# Patient Record
Sex: Male | Born: 1975 | Race: Black or African American | Hispanic: No | Marital: Single | Smoking: Current every day smoker
Health system: Southern US, Community
[De-identification: ages and names within clinical notes are randomized; demographics above are authoritative.]

## PROBLEM LIST (undated history)

## (undated) HISTORY — PX: HAND TENDON SURGERY: SHX663

---

## 2001-03-17 ENCOUNTER — Ambulatory Visit (HOSPITAL_BASED_OUTPATIENT_CLINIC_OR_DEPARTMENT_OTHER): Admission: RE | Admit: 2001-03-17 | Discharge: 2001-03-17 | Payer: Self-pay | Admitting: Orthopedic Surgery

## 2009-04-13 ENCOUNTER — Emergency Department (HOSPITAL_COMMUNITY): Admission: EM | Admit: 2009-04-13 | Discharge: 2009-04-13 | Payer: Self-pay | Admitting: Emergency Medicine

## 2009-04-15 ENCOUNTER — Emergency Department (HOSPITAL_COMMUNITY): Admission: EM | Admit: 2009-04-15 | Discharge: 2009-04-15 | Payer: Self-pay | Admitting: Emergency Medicine

## 2010-04-11 ENCOUNTER — Emergency Department (HOSPITAL_COMMUNITY): Admission: EM | Admit: 2010-04-11 | Discharge: 2010-04-11 | Payer: Self-pay | Admitting: Emergency Medicine

## 2010-12-07 NOTE — Op Note (Signed)
[place]  Patient:    Blake Meyers, Blake Meyers Visit Number: [id number] MRN: [id number]          Service Type: DSU Location: [facility] Attending Physician:  Susa Day Proc. Date: 03/17/01 Adm. Date:  03/17/2001   CC:         Eula Listen, M.D.   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  OPERATION PERFORMED:  Reconstruction of right thumb radial collateral ligament and dorsal radial capsulorrhaphy.  SURGEON:  Katy Fitch. Sypher, Montez Hageman., M.D.  ASSISTANT:  Jonni Sanger, P.A.  ANESTHESIA:  General by LMA.  SUPERVISING ANESTHESIOLOGIST:  Dr. Gelene Mink.  INDICATIONS FOR PROCEDURE:  The patient is a 35 year old male who was referred by Eula Listen, M.D. for evaluation and management of a complex right thumb injury.  He had been previously treated in [facility] by Dr. Arletha Grippe with an attempted reconstruction of his right thumb radial collateral ligament following injury with development of chronic radial collateral ligament and dorsal capsular instability.  He was noted to have both a pronation and palmar subluxation of his proximal phalanx off the metacarpal head.  He had a chronic pain syndrome and pinch impairment.  After informed consent he is brought to the operating room at this time anticipating reconstruction of his right thumb radial collateral ligament with an abductor pollicis longus proximally based tendon graft.  DESCRIPTION OF PROCEDURE:  Blake Meyers was brought to the operating room and placed in supine position on the operating table.  Following induction of general anesthesia by LMA, the right arm  was prepped with Betadine soap and solution and sterilely draped.  1 gm of Ancef was administered as an IV prophylactic antibiotic.  Following exsanguination of the limb with an Esmarch bandage, the arterial tourniquet was inflated to 220 mmHg.  The procedure commenced with excision of his previous dorsal radial surgical scar.  This was extended to a midlateral incision along the radial aspect of the proximal phalanx.  Great care was taken to identify the dorsal radial sensory branch on the dorsal radial aspect of the thumb and protect it with a vessel loop throughout dissection.  There was extensive scarring at the dorsal aspect of the metacarpal head and great care was taken to identify the remnants of the radial collateral ligament origin of the metacarpal head.  The Beaver blade was used to elevate the entire origin of the radial collateral ligament and essentially create a flap of the radial collateral ligament based distally at the critical corner of the proximal phalanx.  Care was taken to preserve the volar plate throughout dissection.  The radial critical corner of the proximal phalanx was exposed and a small cavity created with a microcuret measuring approximately 3 mm in height, 2 mm in width and 2 mm in depth.  The joint was reduced and with great care, two Mellody Dance needles were placed across the base of the proximal phalanx placing a pair of 3-0 Ethibond sutures through the most palmar drill tract initially and preserving the second Keith needle for passage of a more dorsal slip once the abductor pollicis longus tendon graft was placed.  The dorsal slip of the abductor pollicis longus tendons was released at the musculotendinous junction through a short transverse incision, brought through  the first dorsal compartment through a second incision and carefully passed deep to the thenar muscles and deep to the radial sensory branches on the dorsal aspect of the thumb  metacarpal to the wound created at the MP joint.  The bony trough was created on an oblique angle at the neck of the metacarpal to anchor the abductor pollicis longus tendon at a proper angle of approach to recreate the radial collateral ligament.  The abductor pollicis longus tendon graft was then sutured into the drill hole at the base of the proximal phalanx and tensioned appropriately using a polypropylene button on the ulnar aspect of the thumb proximal phalanx tensioning this repair so that it had good inset into the bony trough.  Once the polypropylene button was secured with knotting of the suture slips over the button, the abductor pollicis longus graft was tensioned by lifting it into the bony trough recreating the proper angled pole for radial collateral ligament.  A very satisfactory ligament reconstruction was achieved.  This was then sutured to bone with through bone sutures through the metacarpal head securing the collateral ligament reconstruction proximal to the trough.  The free slip of the abductor pollicis longus was then tacked over the top of this for further reinforcement of this collateral ligament reconstruction.  The entire distally based tongue of capsule and radial collateral ligament was then sewn over the radial collateral ligament tendon transfer repair and appropriately tensioned to create an imbrication of the dorsal capsule. Thereafter, the ligament stability of the radial collateral ligament was checked.  At 0 degrees prior to surgery there was more than 35 degrees of ulnar deviation.  Following reconstruction, there was less than 5 degrees at 0 degrees flexion.  At 30 degrees flexion there was no ulnar deviation versus approximately 30 degrees prior to surgery.  The volar plate was performing normally.  The wound was then irrigated thoroughly and repaired with intradermal 3-0 Prolene as were the two transverse wounds for harvest of the abductor  pollicis longus.  There were no apparent complications.   The tourniquet was released and bleeding controlled by direct pressure. Blake Meyers was placed in a compressive dressing with a thumb spica splint protecting the thumb in a palmar abductor position.  There were no apparent complications.  For aftercare he was given prescriptions for Percocet 5 mg one or two tablets p.o. q.4-6h. p.r.n. pain, Keflex 500 mg one p.o. q.8h. x 4 days as a prophylactic antibiotic and Motrin 600 mg one p.o. q.6h. p.r.n. pain, 30 tablets without refill.Attending Physician:  Susa Day DD:  03/17/01 TD:  03/17/01 Job: [phone]

## 2016-05-28 ENCOUNTER — Encounter (HOSPITAL_COMMUNITY): Payer: Self-pay | Admitting: Emergency Medicine

## 2016-05-28 ENCOUNTER — Emergency Department (HOSPITAL_COMMUNITY)
Admission: EM | Admit: 2016-05-28 | Discharge: 2016-05-28 | Disposition: A | Discharge status: Home / Self Care | Payer: BLUE CROSS/BLUE SHIELD | Attending: Emergency Medicine | Admitting: Emergency Medicine

## 2016-05-28 DIAGNOSIS — T310 Burns involving less than 10% of body surface: Secondary | ICD-10-CM | POA: Diagnosis not present

## 2016-05-28 DIAGNOSIS — T542X1A Toxic effect of corrosive acids and acid-like substances, accidental (unintentional), initial encounter: Secondary | ICD-10-CM | POA: Insufficient documentation

## 2016-05-28 DIAGNOSIS — Y929 Unspecified place or not applicable: Secondary | ICD-10-CM | POA: Insufficient documentation

## 2016-05-28 DIAGNOSIS — F17210 Nicotine dependence, cigarettes, uncomplicated: Secondary | ICD-10-CM | POA: Insufficient documentation

## 2016-05-28 DIAGNOSIS — Y999 Unspecified external cause status: Secondary | ICD-10-CM | POA: Insufficient documentation

## 2016-05-28 DIAGNOSIS — Y939 Activity, unspecified: Secondary | ICD-10-CM | POA: Insufficient documentation

## 2016-05-28 DIAGNOSIS — M79672 Pain in left foot: Secondary | ICD-10-CM | POA: Insufficient documentation

## 2016-05-28 DIAGNOSIS — T25422A Corrosion of unspecified degree of left foot, initial encounter: Secondary | ICD-10-CM

## 2016-05-28 DIAGNOSIS — X12XXXA Contact with other hot fluids, initial encounter: Secondary | ICD-10-CM | POA: Insufficient documentation

## 2016-05-28 MED ORDER — CEPHALEXIN 500 MG PO CAPS
500.0000 mg | ORAL_CAPSULE | Freq: Once | ORAL | Status: AC
  Administered 2016-05-28: 500 mg via ORAL
  Filled 2016-05-28: qty 1

## 2016-05-28 MED ORDER — IBUPROFEN 800 MG PO TABS: 800.0000 mg | ORAL_TABLET | Freq: Three times a day (TID) | ORAL | 0 refills | Status: DC

## 2016-05-28 MED ORDER — CEPHALEXIN 500 MG PO CAPS: 500.0000 mg | ORAL_CAPSULE | Freq: Four times a day (QID) | ORAL | 0 refills | Status: DC

## 2016-05-28 MED ORDER — IBUPROFEN 800 MG PO TABS
800.0000 mg | ORAL_TABLET | Freq: Once | ORAL | Status: AC
  Administered 2016-05-28: 800 mg via ORAL
  Filled 2016-05-28: qty 1
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
## 2016-05-28 NOTE — Discharge Instructions (Signed)
Please cleanse the wound to your foot with soap and water and apply dressing daily until healed. Please elevate your foot is much as possible. Use Keflex with breakfast, lunch, dinner, and at bedtime.

## 2016-12-02 ENCOUNTER — Emergency Department (HOSPITAL_COMMUNITY)
Admission: EM | Admit: 2016-12-02 | Discharge: 2016-12-02 | Disposition: A | Discharge status: Home / Self Care | Payer: BLUE CROSS/BLUE SHIELD | Attending: Emergency Medicine | Admitting: Emergency Medicine

## 2016-12-02 ENCOUNTER — Encounter (HOSPITAL_COMMUNITY): Payer: Self-pay | Admitting: Emergency Medicine

## 2016-12-02 DIAGNOSIS — H1032 Unspecified acute conjunctivitis, left eye: Secondary | ICD-10-CM | POA: Diagnosis not present

## 2016-12-02 DIAGNOSIS — R05 Cough: Secondary | ICD-10-CM | POA: Insufficient documentation

## 2016-12-02 DIAGNOSIS — B309 Viral conjunctivitis, unspecified: Secondary | ICD-10-CM

## 2016-12-02 DIAGNOSIS — R059 Cough, unspecified: Secondary | ICD-10-CM

## 2016-12-02 DIAGNOSIS — F17210 Nicotine dependence, cigarettes, uncomplicated: Secondary | ICD-10-CM | POA: Diagnosis not present

## 2016-12-02 DIAGNOSIS — H578 Other specified disorders of eye and adnexa: Secondary | ICD-10-CM | POA: Diagnosis present

## 2016-12-02 MED ORDER — AZITHROMYCIN 250 MG PO TABS: ORAL_TABLET | ORAL | 0 refills | Status: DC

## 2016-12-02 MED ORDER — PREDNISONE 20 MG PO TABS: 40.0000 mg | ORAL_TABLET | Freq: Every day | ORAL | 0 refills | Status: DC

## 2016-12-02 MED ORDER — ALBUTEROL SULFATE HFA 108 (90 BASE) MCG/ACT IN AERS
2.0000 | INHALATION_SPRAY | Freq: Once | RESPIRATORY_TRACT | Status: AC
  Administered 2016-12-02: 2 via RESPIRATORY_TRACT
  Filled 2016-12-02: qty 6.7

## 2016-12-02 MED ORDER — TOBRAMYCIN 0.3 % OP SOLN
1.0000 [drp] | Freq: Once | OPHTHALMIC | Status: AC
  Administered 2016-12-02: 1 [drp] via OPHTHALMIC
  Filled 2016-12-02: qty 5

## 2016-12-02 MED ORDER — GUAIFENESIN-CODEINE 100-10 MG/5ML PO SYRP: 10.0000 mL | ORAL_SOLUTION | Freq: Three times a day (TID) | ORAL | 0 refills | Status: DC | PRN

## 2016-12-02 NOTE — ED Triage Notes (Signed)
Pt c/o left eye drainage and irritation, productive cough and chest soreness from coughing for one week.
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
## 2016-12-02 NOTE — Discharge Instructions (Signed)
Warm wet compresses on/off to your eye.  1 drop of the tobramycin to the left eye every 4 hrs for 5-7 days.  1-2 puffs of the inhaler every 4-6 hrs as needed.  Follow-up with your doctor or return here for any worsening symptoms

## 2016-12-11 ENCOUNTER — Emergency Department (HOSPITAL_COMMUNITY): Payer: BLUE CROSS/BLUE SHIELD

## 2016-12-11 ENCOUNTER — Emergency Department (HOSPITAL_COMMUNITY)
Admission: EM | Admit: 2016-12-11 | Discharge: 2016-12-11 | Disposition: A | Discharge status: Home / Self Care | Payer: BLUE CROSS/BLUE SHIELD | Attending: Emergency Medicine | Admitting: Emergency Medicine

## 2016-12-11 ENCOUNTER — Encounter (HOSPITAL_COMMUNITY): Payer: Self-pay

## 2016-12-11 DIAGNOSIS — J302 Other seasonal allergic rhinitis: Secondary | ICD-10-CM | POA: Diagnosis not present

## 2016-12-11 DIAGNOSIS — F17210 Nicotine dependence, cigarettes, uncomplicated: Secondary | ICD-10-CM | POA: Diagnosis not present

## 2016-12-11 DIAGNOSIS — R059 Cough, unspecified: Secondary | ICD-10-CM

## 2016-12-11 DIAGNOSIS — R05 Cough: Secondary | ICD-10-CM | POA: Diagnosis not present

## 2016-12-11 DIAGNOSIS — J3089 Other allergic rhinitis: Secondary | ICD-10-CM

## 2016-12-11 MED ORDER — BENZONATATE 100 MG PO CAPS: 200.0000 mg | ORAL_CAPSULE | Freq: Three times a day (TID) | ORAL | 0 refills | Status: DC | PRN

## 2016-12-11 MED ORDER — ALBUTEROL SULFATE (2.5 MG/3ML) 0.083% IN NEBU
2.5000 mg | INHALATION_SOLUTION | Freq: Once | RESPIRATORY_TRACT | Status: AC
Start: 2016-12-11 — End: 2016-12-11
  Administered 2016-12-11: 2.5 mg via RESPIRATORY_TRACT
  Filled 2016-12-11: qty 3

## 2016-12-11 MED ORDER — HYDROCOD POLST-CPM POLST ER 10-8 MG/5ML PO SUER
5.0000 mL | Freq: Once | ORAL | Status: AC
  Administered 2016-12-11: 5 mL via ORAL
  Filled 2016-12-11: qty 5

## 2016-12-11 MED ORDER — AEROCHAMBER Z-STAT PLUS/MEDIUM MISC
1.0000 | Freq: Once | Status: AC
  Administered 2016-12-11: 1

## 2016-12-11 MED ORDER — LORATADINE-PSEUDOEPHEDRINE ER 5-120 MG PO TB12: 1.0000 | ORAL_TABLET | Freq: Two times a day (BID) | ORAL | 0 refills | Status: DC

## 2016-12-11 MED ORDER — IPRATROPIUM-ALBUTEROL 0.5-2.5 (3) MG/3ML IN SOLN
3.0000 mL | Freq: Once | RESPIRATORY_TRACT | Status: AC
  Administered 2016-12-11: 3 mL via RESPIRATORY_TRACT
  Filled 2016-12-11: qty 3

## 2016-12-11 MED ORDER — PROMETHAZINE-CODEINE 6.25-10 MG/5ML PO SYRP: 5.0000 mL | ORAL_SOLUTION | ORAL | 0 refills | Status: DC | PRN

## 2016-12-11 NOTE — ED Notes (Signed)
Spacer given to pt and instructed on use, benefits and cleaning, pt verbalized understanding

## 2016-12-11 NOTE — ED Triage Notes (Signed)
Pt reports being seen here about one week ago and started on Z-pack, steroids, cough syrup, and albuterol inhaler. Reports has finished medication and still not better with productive cough accompanied by chest soreness from coughing. Headache and congestion reported as well.

## 2016-12-11 NOTE — ED Notes (Signed)
RX/verbal/written discharge instructions given to pt and spouse, verbalized understanding, pt ambulated off unit in good condition

## 2016-12-11 NOTE — Discharge Instructions (Signed)
Use the medicines prescribed in addition to using your inhaler with the spacer provided - 2 puffs every 4 hours.  Additionally, cough lozenges, a teaspoon of honey , drinking plenty of fluids are all strategies to try to get over this persistent cough.  Your chest xray is clear.  I suspect your cough is being triggered by allergy and post nasal drip. The allergy medicine should help with this symptom.

## 2016-12-15 NOTE — ED Provider Notes (Signed)
[facility] DEPT Provider Note   CSN: [id number] Arrival date & time: 12/11/16  1925     History   Chief Complaint Chief Complaint  Patient presents with  . Cough  . Nasal Congestion    HPI Blake Meyers is a 41 y.o. male presenting with persistent cough along with nasal congestion and clear drainage, post nasal drip and some sneezing and itchy watery eyes since he was seen here last week, at which time he was diagnosed with conjunctivitis (improved after tx with tobrex tx) and cough which persists despite using albuterol inhaler. He denies sob, weakness, dizziness, fevers, hemoptysis, n/v.  He does have persistent sinus pressure, no nosebleeds and nasal congestion has been watery.   The history is provided by the patient and the spouse.    History reviewed. No pertinent past medical history.  There are no active problems to display for this patient.   Past Surgical History:  Procedure Laterality Date  . HAND TENDON SURGERY         Home Medications    Prior to Admission medications   Medication Sig Start Date End Date Taking? Authorizing Provider  albuterol (PROVENTIL HFA;VENTOLIN HFA) 108 (90 Base) MCG/ACT inhaler Inhale 1-2 puffs into the lungs every 6 (six) hours as needed for wheezing or shortness of breath.   Yes [provider]  benzonatate (TESSALON) 100 MG capsule Take 2 capsules (200 mg total) by mouth 3 (three) times daily as needed. 12/11/16   Burgess Amor, PA-C  loratadine-pseudoephedrine (CLARITIN-D 12 HOUR) 5-120 MG tablet Take 1 tablet by mouth 2 (two) times daily. 12/11/16   Burgess Amor, PA-C  predniSONE (DELTASONE) 20 MG tablet Take 2 tablets (40 mg total) by mouth daily. Patient not taking: Reported on 12/11/2016 12/02/16   Triplett, Tammy, PA-C  promethazine-codeine (PHENERGAN WITH CODEINE) 6.25-10 MG/5ML syrup Take 5 mLs by mouth every 4 (four) hours as needed for cough. 12/11/16   Burgess Amor, PA-C    Family History No family history on  file.  Social History Social History  Substance Use Topics  . Smoking status: Current Every Day Smoker    Packs/day: 0.50    Types: Cigarettes  . Smokeless tobacco: Never Used  . Alcohol use Yes     Comment: occ     Allergies   Chocolate   Review of Systems Review of Systems  Constitutional: Negative for chills and fever.  HENT: Positive for congestion and rhinorrhea. Negative for ear pain, sinus pressure, sore throat, trouble swallowing and voice change.   Eyes: Positive for itching. Negative for discharge, redness and visual disturbance.  Respiratory: Positive for cough. Negative for stridor.   Cardiovascular: Negative for chest pain.  Gastrointestinal: Negative for abdominal pain, nausea and vomiting.  Genitourinary: Negative.      Physical Exam Updated Vital Signs BP 119/75 (BP Location: Right Arm)   Pulse 79   Temp 97.8 F (36.6 C) (Oral)   Resp 18   Ht 5\' 6"  (1.676 m)   Wt 93 kg (205 lb)   SpO2 97%   BMI 33.09 kg/m   Physical Exam  Constitutional: He is oriented to person, place, and time. He appears well-developed and well-nourished.  HENT:  Head: Normocephalic and atraumatic.  Right Ear: Tympanic membrane and ear canal normal.  Left Ear: Tympanic membrane and ear canal normal.  Nose: Rhinorrhea present. No mucosal edema. Right sinus exhibits no maxillary sinus tenderness and no frontal sinus tenderness. Left sinus exhibits no maxillary sinus tenderness and no frontal  sinus tenderness.  Mouth/Throat: Uvula is midline, oropharynx is clear and moist and mucous membranes are normal. No oropharyngeal exudate, posterior oropharyngeal edema, posterior oropharyngeal erythema or tonsillar abscesses.  Pale, boggy nasal mucosa.  Eyes: Lids are normal. Pupils are equal, round, and reactive to light. Right eye exhibits no chemosis and no discharge. Left eye exhibits no chemosis and no discharge. Right conjunctiva is not injected. Left conjunctiva is not injected.    Clear tearing present. Mild conjunctival cobblestoning.  Cardiovascular: Normal rate and normal heart sounds.   Pulmonary/Chest: Effort normal. No respiratory distress. He has no decreased breath sounds. He has wheezes in the right lower field and the left lower field. He has no rales.  Mild expiratory wheeze.   Abdominal: Soft. There is no tenderness.  Musculoskeletal: Normal range of motion.  Neurological: He is alert and oriented to person, place, and time.  Skin: Skin is warm and dry. No rash noted.  Psychiatric: He has a normal mood and affect.     ED Treatments / Results  Labs (all labs ordered are listed, but only abnormal results are displayed) Labs Reviewed - No data to display  EKG  EKG Interpretation None       Radiology No results found.  Procedures Procedures (including critical care time)  Medications Ordered in ED Medications  ipratropium-albuterol (DUONEB) 0.5-2.5 (3) MG/3ML nebulizer solution 3 mL (3 mLs Nebulization Given 12/11/16 2022)  albuterol (PROVENTIL) (2.5 MG/3ML) 0.083% nebulizer solution 2.5 mg (2.5 mg Nebulization Given 12/11/16 2022)  chlorpheniramine-HYDROcodone (TUSSIONEX) 10-8 MG/5ML suspension 5 mL (5 mLs Oral Given 12/11/16 2017)  aerochamber Z-Stat Plus/medium 1 each (1 each Other Given 12/11/16 2140)     Initial Impression / Assessment and Plan / ED Course  I have reviewed the triage vital signs and the nursing notes.  Pertinent labs & imaging results that were available during my care of the patient were reviewed by me and considered in my medical decision making (see chart for details).     Suspect seasonal allergy with PND as trigger for cough.  He was encouraged to continue albuterol mdi, spacer given. Tessalon and claritin started. Wheeze resolved after neb tx here. Plan f/u with pcp if sx persist or worsen.  Final Clinical Impressions(s) / ED Diagnoses   Final diagnoses:  Seasonal allergic rhinitis due to other allergic  trigger  Cough    New Prescriptions Discharge Medication List as of 12/11/2016  9:30 PM    START taking these medications   Details  benzonatate (TESSALON) 100 MG capsule Take 2 capsules (200 mg total) by mouth 3 (three) times daily as needed., Starting Wed 12/11/2016, Print    loratadine-pseudoephedrine (CLARITIN-D 12 HOUR) 5-120 MG tablet Take 1 tablet by mouth 2 (two) times daily., Starting Wed 12/11/2016, Print    promethazine-codeine (PHENERGAN WITH CODEINE) 6.25-10 MG/5ML syrup Take 5 mLs by mouth every 4 (four) hours as needed for cough., Starting Wed 12/11/2016, Print         Burgess Amordol, Brandan Glauber, PA-C 12/15/16 1258    Vanetta MuldersZackowski, Scott, MD 12/17/16 [phone]

## 2016-12-22 DIAGNOSIS — S60221A Contusion of right hand, initial encounter: Secondary | ICD-10-CM | POA: Diagnosis not present

## 2016-12-22 DIAGNOSIS — F17290 Nicotine dependence, other tobacco product, uncomplicated: Secondary | ICD-10-CM | POA: Diagnosis not present

## 2016-12-22 DIAGNOSIS — F17200 Nicotine dependence, unspecified, uncomplicated: Secondary | ICD-10-CM | POA: Diagnosis not present

## 2016-12-22 DIAGNOSIS — J209 Acute bronchitis, unspecified: Secondary | ICD-10-CM | POA: Diagnosis not present

## 2016-12-22 DIAGNOSIS — M79641 Pain in right hand: Secondary | ICD-10-CM | POA: Diagnosis not present

## 2016-12-22 DIAGNOSIS — R079 Chest pain, unspecified: Secondary | ICD-10-CM | POA: Diagnosis not present

## 2016-12-22 DIAGNOSIS — M7989 Other specified soft tissue disorders: Secondary | ICD-10-CM | POA: Diagnosis not present

## 2016-12-24 DIAGNOSIS — Z6833 Body mass index (BMI) 33.0-33.9, adult: Secondary | ICD-10-CM | POA: Diagnosis not present

## 2016-12-24 DIAGNOSIS — J4521 Mild intermittent asthma with (acute) exacerbation: Secondary | ICD-10-CM | POA: Diagnosis not present

## 2016-12-24 DIAGNOSIS — I208 Other forms of angina pectoris: Secondary | ICD-10-CM | POA: Diagnosis not present

## 2017-01-02 DIAGNOSIS — Z6833 Body mass index (BMI) 33.0-33.9, adult: Secondary | ICD-10-CM | POA: Diagnosis not present

## 2017-01-02 DIAGNOSIS — M79641 Pain in right hand: Secondary | ICD-10-CM | POA: Diagnosis not present

## 2017-01-02 DIAGNOSIS — L02211 Cutaneous abscess of abdominal wall: Secondary | ICD-10-CM | POA: Diagnosis not present

## 2017-01-23 DIAGNOSIS — G4730 Sleep apnea, unspecified: Secondary | ICD-10-CM | POA: Diagnosis not present

## 2017-02-18 DIAGNOSIS — G4733 Obstructive sleep apnea (adult) (pediatric): Secondary | ICD-10-CM | POA: Diagnosis not present

## 2017-02-26 DIAGNOSIS — M79672 Pain in left foot: Secondary | ICD-10-CM | POA: Diagnosis not present

## 2017-02-26 DIAGNOSIS — S97122A Crushing injury of left lesser toe(s), initial encounter: Secondary | ICD-10-CM | POA: Diagnosis not present

## 2017-02-26 DIAGNOSIS — Z79899 Other long term (current) drug therapy: Secondary | ICD-10-CM | POA: Diagnosis not present

## 2017-02-26 DIAGNOSIS — F329 Major depressive disorder, single episode, unspecified: Secondary | ICD-10-CM | POA: Diagnosis not present

## 2017-02-26 DIAGNOSIS — F17200 Nicotine dependence, unspecified, uncomplicated: Secondary | ICD-10-CM | POA: Diagnosis not present

## 2017-02-26 DIAGNOSIS — J45909 Unspecified asthma, uncomplicated: Secondary | ICD-10-CM | POA: Diagnosis not present

## 2017-03-19 DIAGNOSIS — G4733 Obstructive sleep apnea (adult) (pediatric): Secondary | ICD-10-CM | POA: Diagnosis not present

## 2017-04-19 DIAGNOSIS — G4733 Obstructive sleep apnea (adult) (pediatric): Secondary | ICD-10-CM | POA: Diagnosis not present

## 2017-05-19 DIAGNOSIS — G4733 Obstructive sleep apnea (adult) (pediatric): Secondary | ICD-10-CM | POA: Diagnosis not present

## 2017-06-16 DIAGNOSIS — R079 Chest pain, unspecified: Secondary | ICD-10-CM | POA: Diagnosis not present

## 2017-06-16 DIAGNOSIS — Z809 Family history of malignant neoplasm, unspecified: Secondary | ICD-10-CM | POA: Diagnosis not present

## 2017-06-16 DIAGNOSIS — Z8249 Family history of ischemic heart disease and other diseases of the circulatory system: Secondary | ICD-10-CM | POA: Diagnosis not present

## 2017-06-16 DIAGNOSIS — Z87891 Personal history of nicotine dependence: Secondary | ICD-10-CM | POA: Diagnosis not present

## 2017-06-16 DIAGNOSIS — R0789 Other chest pain: Secondary | ICD-10-CM | POA: Diagnosis not present

## 2017-06-19 DIAGNOSIS — G4733 Obstructive sleep apnea (adult) (pediatric): Secondary | ICD-10-CM | POA: Diagnosis not present

## 2017-10-22 ENCOUNTER — Encounter: Payer: Self-pay | Admitting: Cardiovascular Disease

## 2017-10-22 ENCOUNTER — Ambulatory Visit (INDEPENDENT_AMBULATORY_CARE_PROVIDER_SITE_OTHER): Payer: BLUE CROSS/BLUE SHIELD | Admitting: Cardiovascular Disease

## 2017-10-22 ENCOUNTER — Encounter: Payer: Self-pay | Admitting: *Deleted

## 2017-10-22 ENCOUNTER — Telehealth: Payer: Self-pay | Admitting: Cardiovascular Disease

## 2017-10-22 VITALS — BP 122/78 | HR 74 | Ht 66.0 in | Wt 202.0 lb

## 2017-10-22 DIAGNOSIS — R55 Syncope and collapse: Secondary | ICD-10-CM

## 2017-10-22 DIAGNOSIS — R079 Chest pain, unspecified: Secondary | ICD-10-CM

## 2017-10-22 DIAGNOSIS — Z9289 Personal history of other medical treatment: Secondary | ICD-10-CM

## 2017-10-22 NOTE — Addendum Note (Signed)
Addended by: Lesle ChrisHILL, ANGELA G on: 10/22/2017 11:37 AM   Modules accepted: Orders

## 2017-10-22 NOTE — Patient Instructions (Addendum)
Medication Instructions:   Stop Diltiazem.   Continue all other medications.    Labwork: none  Testing/Procedures:  Your physician has requested that you have a lexiscan myoview. For further information please visit [email or web address]. Please follow instruction sheet, as given.  Your physician has recommended that you wear a 30 day event monitor. Event monitors are medical devices that record the heart's electrical activity. Doctors most often us these monitors to diagnose arrhythmias. Arrhythmias are problems with the speed or rhythm of the heartbeat. The monitor is a small, portable device. You can wear one while you do your normal daily activities. This is usually used to diagnose what is causing palpitations/syncope (passing out).  Office will contact with results via phone or letter.    Follow-Up: 6-8 weeks   Any Other Special Instructions Will Be Listed Below (If Applicable).  If you need a refill on your cardiac medications before your next appointment, please call your pharmacy.

## 2017-10-22 NOTE — Progress Notes (Signed)
CARDIOLOGY CONSULT NOTE  Patient ID: Blake Meyers MRN: [id number] DOB/AGE: 42/09/1975 42 y.o.  Admit date: (Not on file) Primary Physician: Toma DeitersHasanaj, Xaje A, MD Referring Physician:  Toma DeitersHasanaj, Xaje A, MD  Reason for Consultation: Syncope and chest pain  HPI: Blake Meyers is a 42 y.o. male who is being seen today for the evaluation of syncope and chest pain at the request of Hasanaj, Myra GianottiXaje A, MD.   [facility] was [facility] with chest pain and syncope and reportedly had a negative workup.  I do not have any records at this point.  He apparently passed out while folding clothes and lost consciousness for 2 minutes.  He felt sore on the left side of his chest.  He does have sleep apnea and uses CPAP.  He was reportedly tachycardic at his PCPs office and was started on diltiazem.  He does not recall the events surrounding his syncopal episode.  He told me he had some chest soreness in the precordial region prior to passing out.  He feels fatigued.  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  He said he underwent a stress test at that time.  He said they did a head CT, MRI, and carotid Dopplers at [facility].  He was prescribed Lortab for chest pain.  This made him tachycardic.  It was discontinued by his PCP who then started diltiazem.  Patient says when he takes diltiazem he feels a pressure inside his chest and then feels fatigued and dizzy.   Allergies  Allergen Reactions  . Chocolate     Current Outpatient Medications  Medication Sig Dispense Refill  . aspirin EC 81 MG tablet Take 81 mg by mouth daily.    Marland Kitchen. diltiazem (DILACOR XR) 120 MG 24 hr capsule Take 120 mg by mouth daily.    . traZODone (DESYREL) 50 MG tablet Take 50 mg by mouth at bedtime.     No current facility-administered medications for this visit.     No past medical history on file.  Past Surgical History:  Procedure  Laterality Date  . HAND TENDON SURGERY      Social History   Socioeconomic History  . Marital status: Single    Spouse name: Not on file  . Number of children: Not on file  . Years of education: Not on file  . Highest education level: Not on file  Occupational History  . Not on file  Social Needs  . Financial resource strain: Not on file  . Food insecurity:    Worry: Not on file    Inability: Not on file  . Transportation needs:    Medical: Not on file    Non-medical: Not on file  Tobacco Use  . Smoking status: Current Every Day Smoker    Packs/day: 0.50    Types: Cigarettes  . Smokeless tobacco: Never Used  Substance and Sexual Activity  . Alcohol use: Yes    Comment: occ  . Drug use: No  . Sexual activity: Not on file  Lifestyle  . Physical activity:    Days per week: Not on file    Minutes per session: Not on file  . Stress: Not on file  Relationships  . Social connections:    Talks on phone: Not on file    Gets together: Not on file    Attends religious service: Not on file  Active member of club or organization: Not on file    Attends meetings of clubs or organizations: Not on file    Relationship status: Not on file  . Intimate partner violence:    Fear of current or ex partner: Not on file    Emotionally abused: Not on file    Physically abused: Not on file    Forced sexual activity: Not on file  Other Topics Concern  . Not on file  Social History Narrative  . Not on file     No family history of premature CAD in 1st degree relatives.  Current Meds  Medication Sig  . aspirin EC 81 MG tablet Take 81 mg by mouth daily.  Marland Kitchen diltiazem (DILACOR XR) 120 MG 24 hr capsule Take 120 mg by mouth daily.  . traZODone (DESYREL) 50 MG tablet Take 50 mg by mouth at bedtime.      Review of systems complete and found to be negative unless listed above in HPI    Physical exam Blood pressure 122/78, pulse 74, height 5\' 6"  (1.676 m), weight 202 lb (91.6 kg),  SpO2 98 %. General: NAD Neck: No JVD, no thyromegaly or thyroid nodule.  Lungs: Clear to auscultation bilaterally with normal respiratory effort. CV: Nondisplaced PMI. Regular rate and rhythm, normal S1/S2, no S3/S4, no murmur.  No peripheral edema.  No carotid bruit.   Abdomen: Soft, nontender, no distention.  Skin: Intact without lesions or rashes.  Neurologic: Alert and oriented x 3.  Psych: Normal affect. Extremities: No clubbing or cyanosis.  HEENT: Normal.   ECG: Most recent ECG reviewed.   Labs: No results found for: K, BUN, CREATININE, ALT, TSH, HGB   Lipids: No results found for: LDLCALC, LDLDIRECT, CHOL, TRIG, HDL      ASSESSMENT AND PLAN:  1.  Syncope and chest pain: Unclear etiology.  He reportedly has a history of an MI.  I will try to obtain records from [facility] from almost a decade ago when he was diagnosed with this.  I will try to obtain a copy of the stress test report.  He said he is unable to walk on a treadmill. I will proceed with a nuclear myocardial perfusion imaging study to evaluate for ischemic heart disease (Lexiscan Myoview). I will also obtain a 30-day event monitor.  I will discontinue diltiazem given the side effects he is experiencing.     Disposition: Follow up in 6-8 weeks.   Signed: Prentice Docker, M.D., F.A.C.C.  10/22/2017, 11:03 AM

## 2017-10-22 NOTE — Telephone Encounter (Signed)
Pre-cert Verification for the following procedure   Stress test scheduled for 10/24/17 at [facility]

## 2017-10-24 ENCOUNTER — Encounter (HOSPITAL_COMMUNITY)
Admission: RE | Admit: 2017-10-24 | Discharge: 2017-10-24 | Disposition: A | Discharge status: Home / Self Care | Payer: Commercial Managed Care - PPO | Source: Ambulatory Visit | Attending: Cardiovascular Disease | Admitting: Cardiovascular Disease

## 2017-10-24 ENCOUNTER — Encounter (HOSPITAL_COMMUNITY): Payer: Self-pay

## 2017-10-24 ENCOUNTER — Encounter (HOSPITAL_BASED_OUTPATIENT_CLINIC_OR_DEPARTMENT_OTHER)
Admission: RE | Admit: 2017-10-24 | Discharge: 2017-10-24 | Disposition: A | Discharge status: Home / Self Care | Payer: Commercial Managed Care - PPO | Source: Ambulatory Visit | Attending: Cardiovascular Disease | Admitting: Cardiovascular Disease

## 2017-10-24 DIAGNOSIS — R079 Chest pain, unspecified: Secondary | ICD-10-CM | POA: Insufficient documentation

## 2017-10-24 DIAGNOSIS — R55 Syncope and collapse: Secondary | ICD-10-CM | POA: Insufficient documentation

## 2017-10-24 LAB — NM MYOCAR MULTI W/SPECT W/WALL MOTION / EF
CHL CUP NUCLEAR SRS: 0
CHL CUP NUCLEAR SSS: 0
LHR: 0.42
LV dias vol: 116 mL (ref 62–150)
LV sys vol: 45 mL
NUC STRESS TID: 1.01
Peak HR: 109 {beats}/min
Rest HR: 63 {beats}/min
SDS: 0

## 2017-10-24 MED ORDER — TECHNETIUM TC 99M TETROFOSMIN IV KIT
30.0000 | PACK | Freq: Once | INTRAVENOUS | Status: AC | PRN
  Administered 2017-10-24: 33 via INTRAVENOUS

## 2017-10-24 MED ORDER — SODIUM CHLORIDE 0.9% FLUSH
INTRAVENOUS | Status: AC
  Administered 2017-10-24: 10 mL via INTRAVENOUS
  Filled 2017-10-24: qty 10

## 2017-10-24 MED ORDER — REGADENOSON 0.4 MG/5ML IV SOLN
INTRAVENOUS | Status: AC
  Administered 2017-10-24: 0.4 mg via INTRAVENOUS
  Filled 2017-10-24: qty 5

## 2017-10-24 MED ORDER — TECHNETIUM TC 99M TETROFOSMIN IV KIT
10.0000 | PACK | Freq: Once | INTRAVENOUS | Status: AC | PRN
  Administered 2017-10-24: 11 via INTRAVENOUS

## 2017-10-27 ENCOUNTER — Telehealth: Payer: Self-pay | Admitting: *Deleted

## 2017-10-27 ENCOUNTER — Encounter: Payer: Self-pay | Admitting: *Deleted

## 2017-10-27 ENCOUNTER — Telehealth: Payer: Self-pay | Admitting: Cardiovascular Disease

## 2017-10-27 NOTE — Telephone Encounter (Signed)
Patient called to get results of recent stress test.  Please call patient in regards to him returning to work.   Please call [phone].

## 2017-10-27 NOTE — Telephone Encounter (Signed)
Please clear him from work for this week, please have him call us back next Monday with update on symptoms so that Dr Kirtland BouchardK may reassess. He needs to stay well hydrated, drinking 4-6 bottles of water a day.   Dina RichJonathan Shayde Gervacio MD

## 2017-10-27 NOTE — Telephone Encounter (Signed)
Patient still c/o a lot of dizziness / lightheadedness when going from sitting to standing.  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

## 2017-10-27 NOTE — Telephone Encounter (Signed)
Notes recorded by Lesle ChrisHill, Angela G, LPN on 8/2/95624/02/2018 at 4:56 PM EDT Patient notified. Copy to pmd. Follow up scheduled for 12/02/2017 with Dr. Purvis SheffieldKoneswaran. ------  Notes recorded by Antoine PocheBranch, Jonathan F, MD on 10/27/2017 at 1:49 PM EDT Stress test looks ok, no strong evidence of blockages. Has he had any recurrence of syncope or severe lighteheadedness/dizziness. Please forward to me his response in a phone note to decide on returning to work. What type of work does he do?

## 2017-10-27 NOTE — Telephone Encounter (Signed)
error 

## 2017-10-27 NOTE — Telephone Encounter (Signed)
Patient notified.  Will give new work note & he can pick up anytime tomorrow.  Instructed to call back on Monday, 11/03/2017 to update on symptoms.  He verbalized understanding.

## 2017-10-28 ENCOUNTER — Encounter: Payer: Self-pay | Admitting: *Deleted

## 2017-11-04 ENCOUNTER — Telehealth: Payer: Self-pay | Admitting: *Deleted

## 2017-11-04 ENCOUNTER — Encounter: Payer: Self-pay | Admitting: *Deleted

## 2017-11-04 NOTE — Telephone Encounter (Signed)
Should not work alone. Can go back to work.

## 2017-11-04 NOTE — Telephone Encounter (Signed)
Patient came by office late yesterday evening to give update on his symptoms.  Checking to see when he can go back to work again.  Stated that he still has the dizziness, lightheadedness, but has not had any syncope.  Patient wanting to know if okay to go back to work.  If he goes back to work on nights (what he has been doing), he will be working alone.  If the recommendation is made that he should not work alone due to these syncopal episodes, his job will put him on days or first shift.  Message sent to provider for answer.

## 2017-11-04 NOTE — Telephone Encounter (Signed)
Left detailed message on voice mail of fiance Loni Dolly(Brenda Rumley).  Will do note & place up front for patient pickup.

## 2017-11-04 NOTE — Telephone Encounter (Signed)
Attempted to notify - [employer] user has calling restrictions & call could not go through.

## 2017-11-07 ENCOUNTER — Encounter (INDEPENDENT_AMBULATORY_CARE_PROVIDER_SITE_OTHER): Payer: Commercial Managed Care - PPO

## 2017-11-07 DIAGNOSIS — R079 Chest pain, unspecified: Secondary | ICD-10-CM | POA: Diagnosis not present

## 2017-11-07 DIAGNOSIS — R55 Syncope and collapse: Secondary | ICD-10-CM

## 2017-11-18 ENCOUNTER — Telehealth: Payer: Self-pay | Admitting: Cardiovascular Disease

## 2017-11-18 NOTE — Telephone Encounter (Signed)
Noted will await total results once completed

## 2017-11-18 NOTE — Telephone Encounter (Signed)
Patient called stating that he is allergic to the monitor strips. He spoke with the company yesterday. They are going to send him a different type. States that the monitor was turned off yesterday. (FYI)

## 2017-11-20 ENCOUNTER — Telehealth: Payer: Self-pay | Admitting: Cardiovascular Disease

## 2017-11-20 NOTE — Telephone Encounter (Signed)
Pt says he is due to see pcp in July and has not been referred to neurology at this time - has 2.5 more week on event monitor

## 2017-11-20 NOTE — Telephone Encounter (Signed)
Has he passed out again? He shouldn't work alone. If this means he has to work the day shift, then that is what I would recommend. Awaiting final results from event monitor.

## 2017-11-20 NOTE — Telephone Encounter (Signed)
Received a call from Blake Meyers in regards to a release form that was faxed to our office today.  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 They presently have him on days. Mr. [name] concern is the safety of the patient

## 2017-11-20 NOTE — Telephone Encounter (Signed)
Spoke with Blake Meyers who is concerned with pt working (whether alone or not)  from [employer].  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 Spoke with pt as well and he denies passing out but does still c/o dizziness

## 2017-11-20 NOTE — Telephone Encounter (Signed)
Mr. [name] would like to be called on his cell # [phone].

## 2017-11-20 NOTE — Telephone Encounter (Signed)
May need to hold off in that case, at least until we have more data. Has he seen any other physicians (PCP, neurology)?

## 2017-11-20 NOTE — Telephone Encounter (Signed)
Can hold off on working until we have results if he is experiencing significant symptoms.

## 2017-11-20 NOTE — Telephone Encounter (Signed)
LM to return call Mr. [name]

## 2017-11-24 ENCOUNTER — Encounter: Payer: Self-pay | Admitting: *Deleted

## 2017-11-24 NOTE — Telephone Encounter (Signed)
Left messages with Mr Blake Meyers and also spoke with pt - he is agreeable to remain out of work until monitor results are completed and is cleared from a cardiac standpoint. Pt will come by tomorrow and pick up letter.

## 2017-12-02 ENCOUNTER — Encounter: Payer: Self-pay | Admitting: *Deleted

## 2017-12-02 ENCOUNTER — Ambulatory Visit (INDEPENDENT_AMBULATORY_CARE_PROVIDER_SITE_OTHER): Payer: Commercial Managed Care - PPO | Admitting: Cardiovascular Disease

## 2017-12-02 ENCOUNTER — Other Ambulatory Visit: Payer: Self-pay

## 2017-12-02 ENCOUNTER — Encounter: Payer: Self-pay | Admitting: Cardiovascular Disease

## 2017-12-02 VITALS — BP 132/79 | HR 64 | Ht 66.0 in | Wt 215.0 lb

## 2017-12-02 DIAGNOSIS — G4730 Sleep apnea, unspecified: Secondary | ICD-10-CM

## 2017-12-02 DIAGNOSIS — R55 Syncope and collapse: Secondary | ICD-10-CM | POA: Diagnosis not present

## 2017-12-02 DIAGNOSIS — Z713 Dietary counseling and surveillance: Secondary | ICD-10-CM

## 2017-12-02 DIAGNOSIS — R079 Chest pain, unspecified: Secondary | ICD-10-CM

## 2017-12-02 NOTE — Progress Notes (Signed)
SUBJECTIVE: The patient returns for follow-up after undergoing cardiovascular testing performed for the evaluation of chest pain and syncope.  Nuclear stress test on 10/24/2017 was low risk.  Calculated LVEF 61%.  There is a small mild defect in the apical inferior region suggestive of variable soft tissue attenuation versus a mild ischemic territory.  The patient denies any symptoms of chest pain, palpitations, shortness of breath, lightheadedness, dizziness, leg swelling, orthopnea, PND, and syncope.  He wants to begin exercising and losing weight.  He has sleep apnea and consistently uses CPAP.    Review of Systems: As per "subjective", otherwise negative.  Allergies  Allergen Reactions  . Chocolate     Current Outpatient Medications  Medication Sig Dispense Refill  . aspirin EC 81 MG tablet Take 81 mg by mouth daily.    . traZODone (DESYREL) 50 MG tablet Take 50 mg by mouth at bedtime.     No current facility-administered medications for this visit.     No past medical history on file.  Past Surgical History:  Procedure Laterality Date  . HAND TENDON SURGERY      Social History   Socioeconomic History  . Marital status: Single    Spouse name: Not on file  . Number of children: Not on file  . Years of education: Not on file  . Highest education level: Not on file  Occupational History  . Not on file  Social Needs  . Financial resource strain: Not on file  . Food insecurity:    Worry: Not on file    Inability: Not on file  . Transportation needs:    Medical: Not on file    Non-medical: Not on file  Tobacco Use  . Smoking status: Current Every Day Smoker    Packs/day: 0.50    Types: Cigarettes  . Smokeless tobacco: Never Used  Substance and Sexual Activity  . Alcohol use: Yes    Comment: occ  . Drug use: No  . Sexual activity: Not on file  Lifestyle  . Physical activity:    Days per week: Not on file    Minutes per session: Not on file  .  Stress: Not on file  Relationships  . Social connections:    Talks on phone: Not on file    Gets together: Not on file    Attends religious service: Not on file    Active member of club or organization: Not on file    Attends meetings of clubs or organizations: Not on file    Relationship status: Not on file  . Intimate partner violence:    Fear of current or ex partner: Not on file    Emotionally abused: Not on file    Physically abused: Not on file    Forced sexual activity: Not on file  Other Topics Concern  . Not on file  Social History Narrative  . Not on file     Vitals:   12/02/17 1609  BP: 132/79  Pulse: 64  SpO2: 100%  Weight: 215 lb (97.5 kg)  Height:  (1.676 m)    Wt Readings from Last 3 Encounters:  12/02/17 215 lb (97.5 kg)  10/22/17 202 lb (91.6 kg)  12/11/16 205 lb (93 kg)     PHYSICAL EXAM General: NAD HEENT: Normal. Neck: No JVD, no thyromegaly. Lungs: Clear to auscultation bilaterally with normal respiratory effort. CV: Regular rate and rhythm, normal S1/S2, no S3/S4, no murmur. No pretibial or periankle  edema.  No carotid bruit.   Abdomen: Soft, nontender, no distention.  Neurologic: Alert and oriented.  Psych: Normal affect. Skin: Normal. Musculoskeletal: No gross deformities.    ECG: Most recent ECG reviewed.   Labs: No results found for: K, BUN, CREATININE, ALT, TSH, HGB   Lipids: No results found for: LDLCALC, LDLDIRECT, CHOL, TRIG, HDL     ASSESSMENT AND PLAN:  1.  Syncope and chest pain: Unclear etiology.  May have been vasovagal.  He underwent a low risk nuclear stress test as detailed above.  Cardiac event monitoring has demonstrated sinus rhythm with sinus arrhythmia.  He has had no further episodes.  2.  Sleep apnea: Uses CPAP daily.  3.  I educated them and provided them with dietary and exercise recommendations.   Disposition: Follow up as needed   Prentice Docker, M.D., F.A.C.C.

## 2017-12-02 NOTE — Patient Instructions (Signed)
Medication Instructions:  Continue all current medications.  Labwork: none  Testing/Procedures: none  Follow-Up: As needed.    Any Other Special Instructions Will Be Listed Below (If Applicable).  If you need a refill on your cardiac medications before your next appointment, please call your pharmacy.  

## 2017-12-23 ENCOUNTER — Telehealth: Payer: Self-pay | Admitting: *Deleted

## 2017-12-23 NOTE — Telephone Encounter (Signed)
Notes recorded by Lesle ChrisHill, Maurico Perrell G, LPN on 1/4/78296/10/2017 at 10:32 AM EDT Patient notified detailed voice message. Copy to pmd. Follow up as needed. ------  Notes recorded by Laqueta LindenKoneswaran, Suresh A, MD on 12/19/2017 at 12:33 PM EDT   Sinus rhythm and sinus arrhythmia seen. No significant arrhythmias.

## 2018-04-22 DIAGNOSIS — K450 Other specified abdominal hernia with obstruction, without gangrene: Secondary | ICD-10-CM | POA: Diagnosis not present

## 2018-04-22 DIAGNOSIS — J4520 Mild intermittent asthma, uncomplicated: Secondary | ICD-10-CM | POA: Diagnosis not present

## 2018-04-22 DIAGNOSIS — I10 Essential (primary) hypertension: Secondary | ICD-10-CM | POA: Diagnosis not present

## 2018-04-22 DIAGNOSIS — Z6833 Body mass index (BMI) 33.0-33.9, adult: Secondary | ICD-10-CM | POA: Diagnosis not present

## 2018-04-22 DIAGNOSIS — Z6835 Body mass index (BMI) 35.0-35.9, adult: Secondary | ICD-10-CM | POA: Diagnosis not present

## 2018-04-22 DIAGNOSIS — G4733 Obstructive sleep apnea (adult) (pediatric): Secondary | ICD-10-CM | POA: Diagnosis not present

## 2018-04-22 DIAGNOSIS — Z79899 Other long term (current) drug therapy: Secondary | ICD-10-CM | POA: Diagnosis not present

## 2018-04-29 DIAGNOSIS — R109 Unspecified abdominal pain: Secondary | ICD-10-CM | POA: Diagnosis not present

## 2018-04-29 DIAGNOSIS — Z6835 Body mass index (BMI) 35.0-35.9, adult: Secondary | ICD-10-CM | POA: Diagnosis not present

## 2018-04-29 DIAGNOSIS — R1033 Periumbilical pain: Secondary | ICD-10-CM | POA: Diagnosis not present

## 2018-05-01 DIAGNOSIS — R109 Unspecified abdominal pain: Secondary | ICD-10-CM | POA: Diagnosis not present

## 2018-05-06 DIAGNOSIS — R109 Unspecified abdominal pain: Secondary | ICD-10-CM | POA: Diagnosis not present

## 2018-05-06 DIAGNOSIS — R918 Other nonspecific abnormal finding of lung field: Secondary | ICD-10-CM | POA: Diagnosis not present

## 2018-05-06 DIAGNOSIS — K439 Ventral hernia without obstruction or gangrene: Secondary | ICD-10-CM | POA: Diagnosis not present

## 2018-05-06 DIAGNOSIS — Z6835 Body mass index (BMI) 35.0-35.9, adult: Secondary | ICD-10-CM | POA: Diagnosis not present

## 2018-05-08 DIAGNOSIS — R918 Other nonspecific abnormal finding of lung field: Secondary | ICD-10-CM | POA: Diagnosis not present

## 2018-05-14 IMAGING — DX DG CHEST 2V
2 series · 2 of 2 positions shown · non-contrast
Comparison: None.

CLINICAL DATA: Acute onset of productive cough and generalized
chest soreness. Initial encounter.

EXAM:
CHEST  2 VIEW

[chest pa]
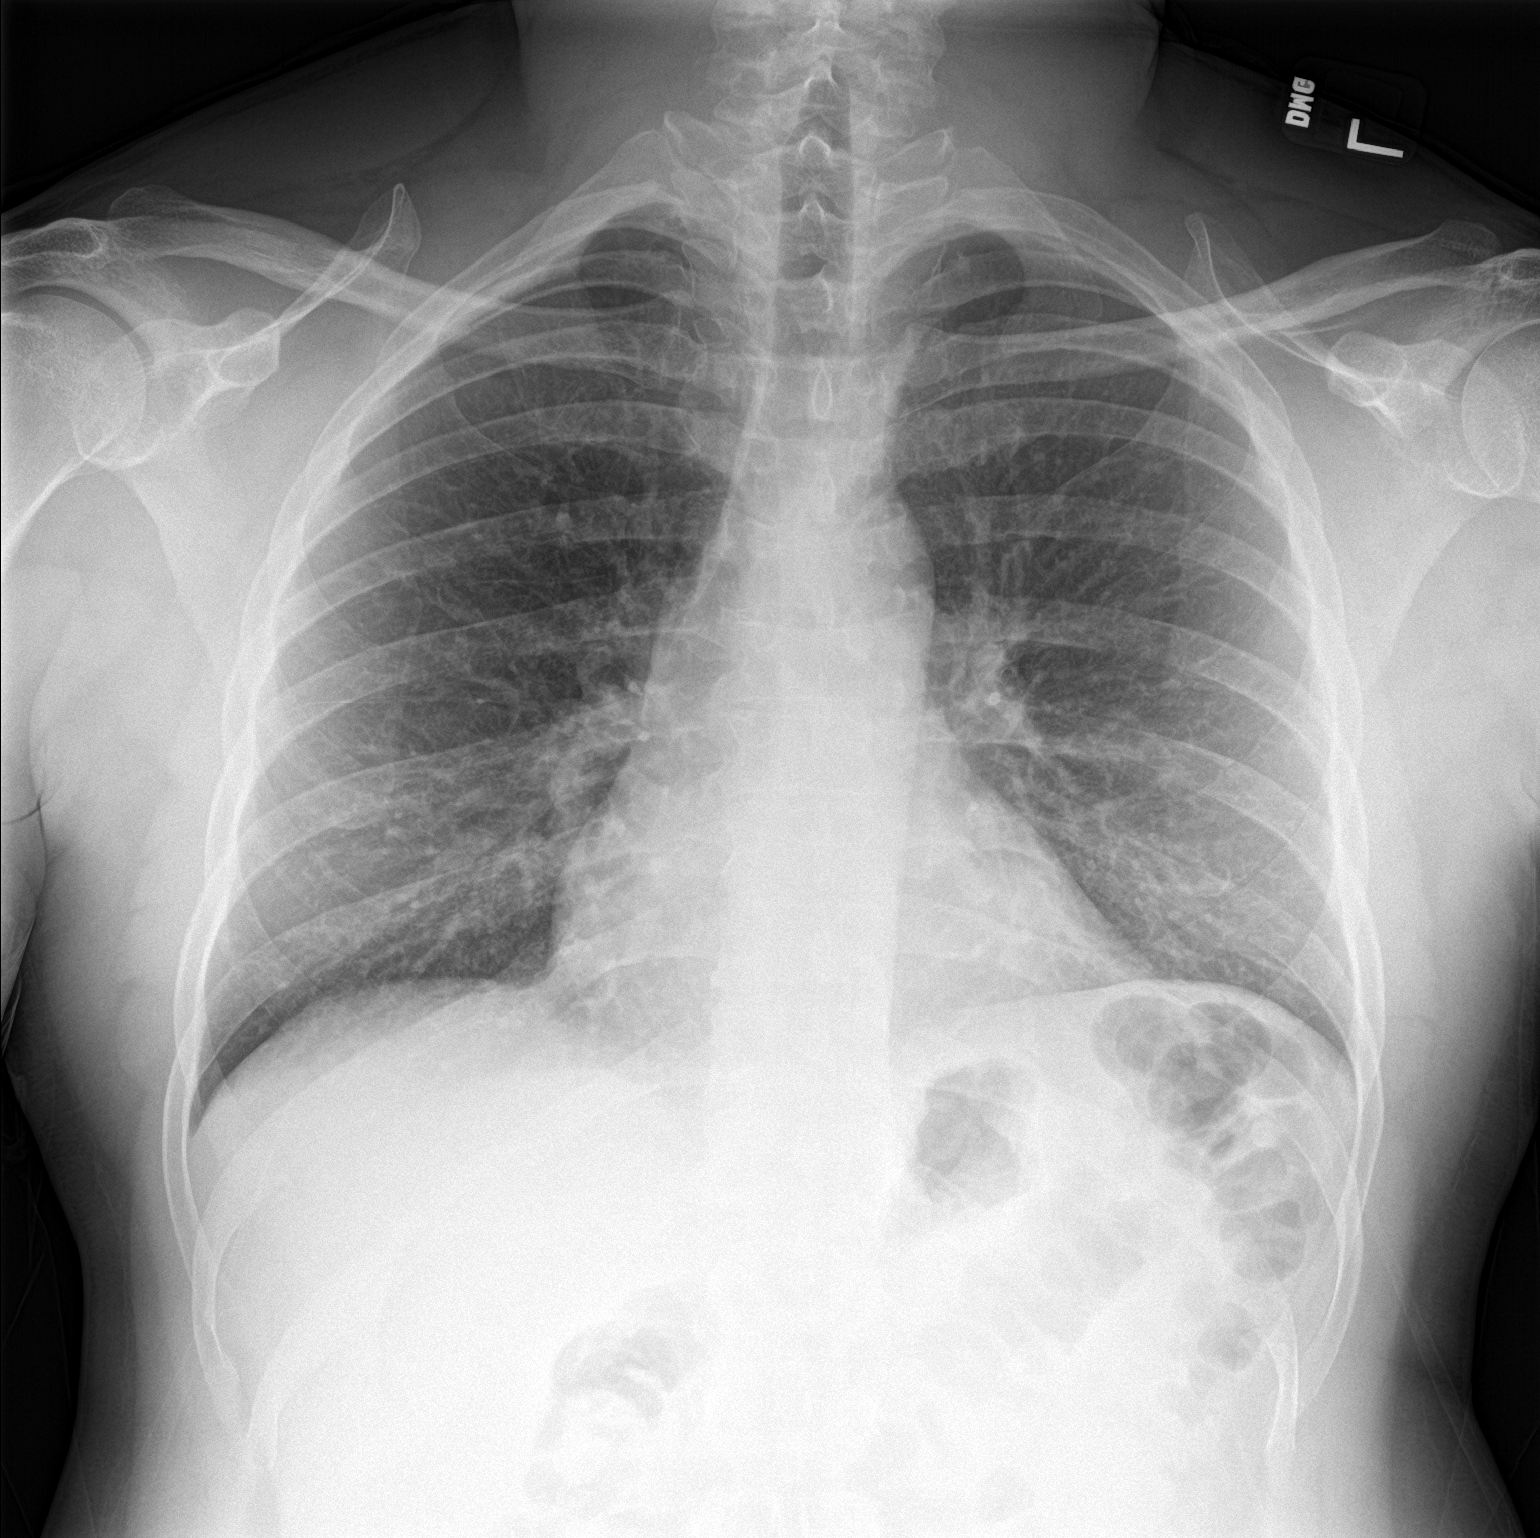

[chest lat]
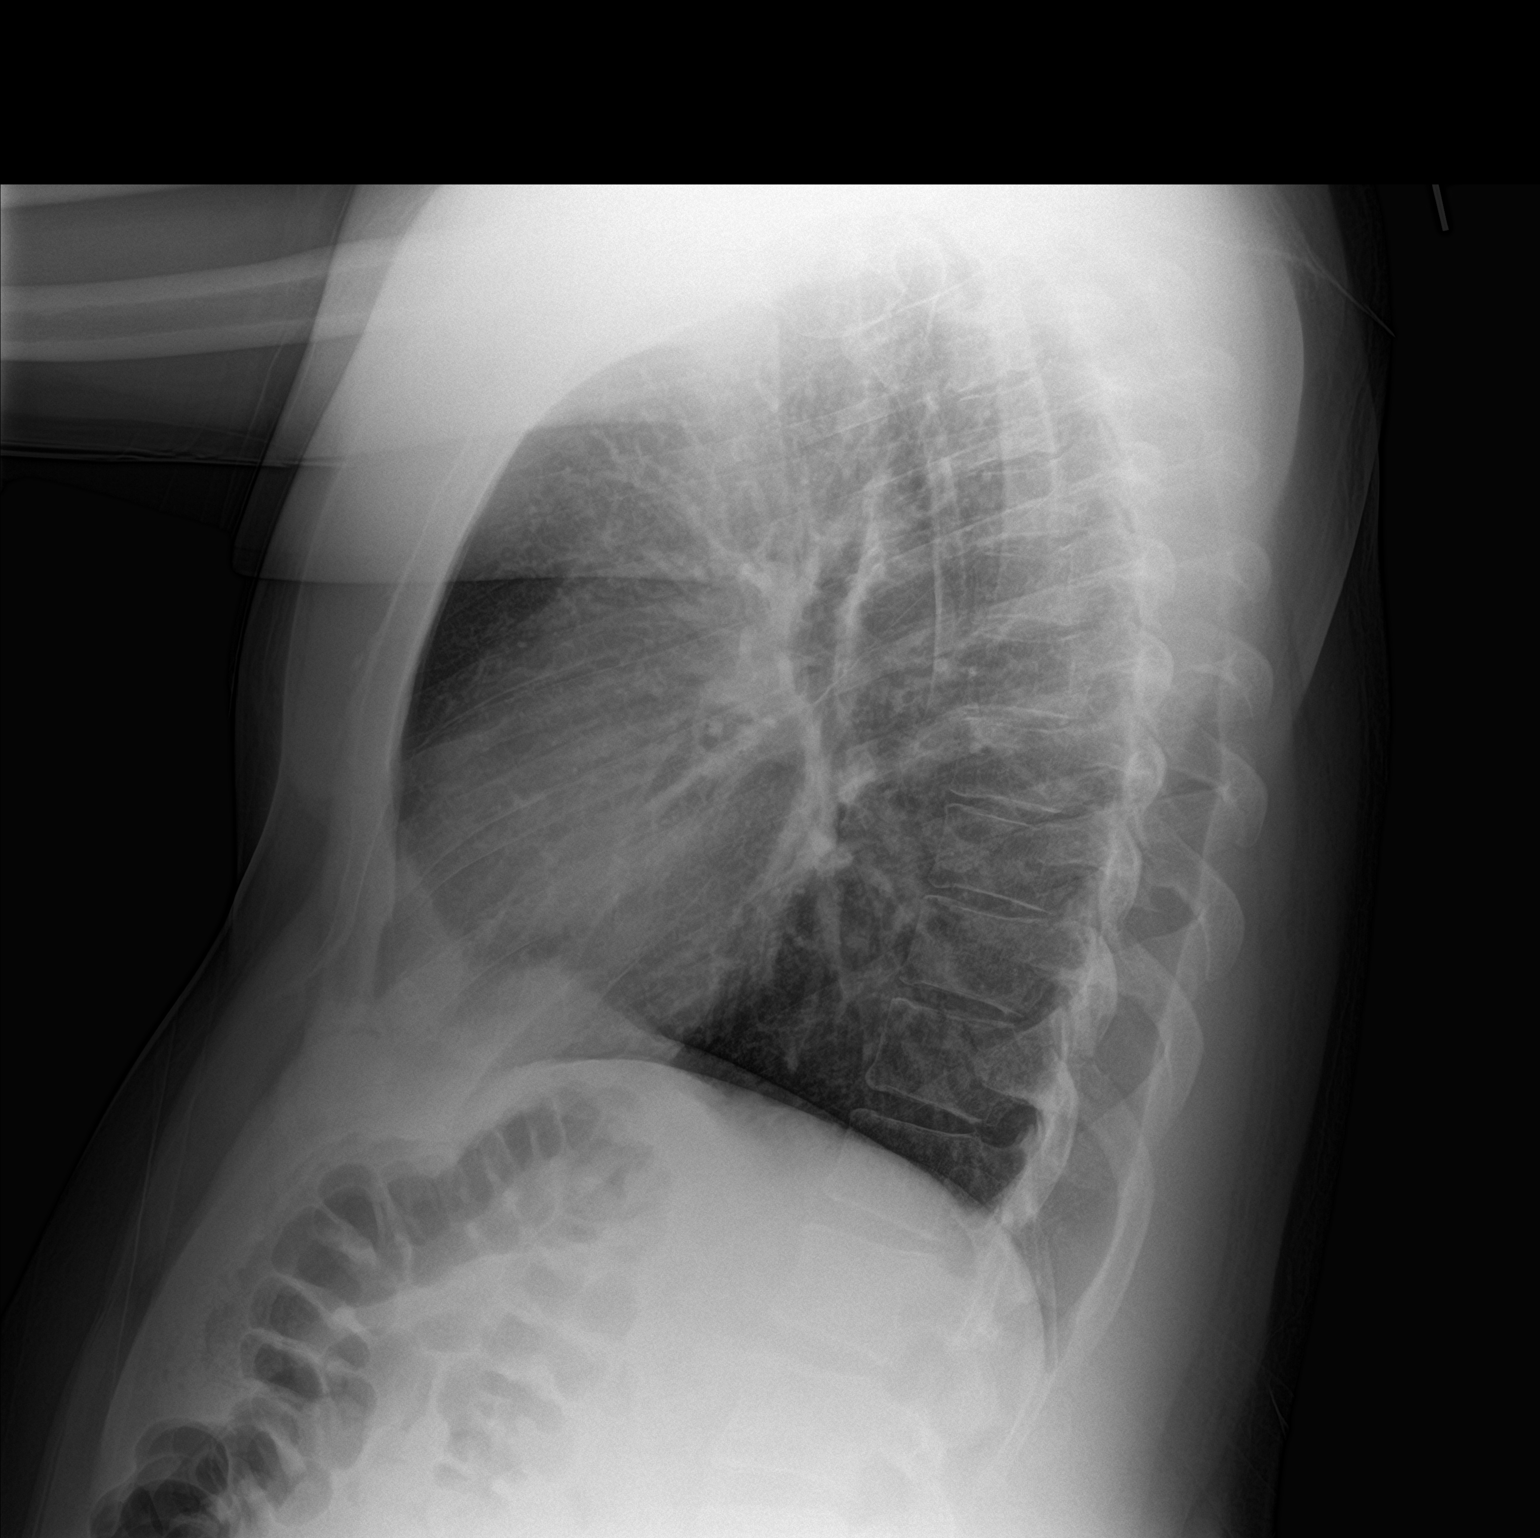

[2 of 2 positions shown; findings below may reference images not displayed]

FINDINGS: The lungs are well-aerated and clear. There is no evidence of focal
opacification, pleural effusion or pneumothorax.

The heart is normal in size; the mediastinal contour is within
normal limits. No acute osseous abnormalities are seen.
IMPRESSION: No acute cardiopulmonary process seen.

## 2018-05-25 DIAGNOSIS — Z91018 Allergy to other foods: Secondary | ICD-10-CM | POA: Diagnosis not present

## 2018-05-25 DIAGNOSIS — I10 Essential (primary) hypertension: Secondary | ICD-10-CM | POA: Diagnosis not present

## 2018-05-25 DIAGNOSIS — M549 Dorsalgia, unspecified: Secondary | ICD-10-CM | POA: Diagnosis not present

## 2018-05-25 DIAGNOSIS — Z8249 Family history of ischemic heart disease and other diseases of the circulatory system: Secondary | ICD-10-CM | POA: Diagnosis not present

## 2018-05-25 DIAGNOSIS — Z833 Family history of diabetes mellitus: Secondary | ICD-10-CM | POA: Diagnosis not present

## 2018-05-25 DIAGNOSIS — K429 Umbilical hernia without obstruction or gangrene: Secondary | ICD-10-CM | POA: Diagnosis not present

## 2018-05-25 DIAGNOSIS — F17200 Nicotine dependence, unspecified, uncomplicated: Secondary | ICD-10-CM | POA: Diagnosis not present

## 2018-05-25 DIAGNOSIS — G4700 Insomnia, unspecified: Secondary | ICD-10-CM | POA: Diagnosis not present

## 2018-05-25 DIAGNOSIS — Z7982 Long term (current) use of aspirin: Secondary | ICD-10-CM | POA: Diagnosis not present

## 2018-05-25 DIAGNOSIS — J45909 Unspecified asthma, uncomplicated: Secondary | ICD-10-CM | POA: Diagnosis not present

## 2018-05-25 DIAGNOSIS — G8929 Other chronic pain: Secondary | ICD-10-CM | POA: Diagnosis not present

## 2018-05-25 DIAGNOSIS — G4730 Sleep apnea, unspecified: Secondary | ICD-10-CM | POA: Diagnosis not present

## 2018-05-25 DIAGNOSIS — Z79899 Other long term (current) drug therapy: Secondary | ICD-10-CM | POA: Diagnosis not present

## 2018-05-25 DIAGNOSIS — K5900 Constipation, unspecified: Secondary | ICD-10-CM | POA: Diagnosis not present

## 2018-05-26 DIAGNOSIS — K5900 Constipation, unspecified: Secondary | ICD-10-CM | POA: Diagnosis not present

## 2018-05-26 DIAGNOSIS — K429 Umbilical hernia without obstruction or gangrene: Secondary | ICD-10-CM | POA: Diagnosis not present

## 2018-05-26 DIAGNOSIS — I10 Essential (primary) hypertension: Secondary | ICD-10-CM | POA: Diagnosis not present

## 2018-05-26 DIAGNOSIS — G4700 Insomnia, unspecified: Secondary | ICD-10-CM | POA: Diagnosis not present

## 2018-05-26 DIAGNOSIS — Z91018 Allergy to other foods: Secondary | ICD-10-CM | POA: Diagnosis not present

## 2018-05-26 DIAGNOSIS — Z79899 Other long term (current) drug therapy: Secondary | ICD-10-CM | POA: Diagnosis not present

## 2018-05-26 DIAGNOSIS — Z8249 Family history of ischemic heart disease and other diseases of the circulatory system: Secondary | ICD-10-CM | POA: Diagnosis not present

## 2018-05-26 DIAGNOSIS — G4730 Sleep apnea, unspecified: Secondary | ICD-10-CM | POA: Diagnosis not present

## 2018-05-26 DIAGNOSIS — G8929 Other chronic pain: Secondary | ICD-10-CM | POA: Diagnosis not present

## 2018-05-26 DIAGNOSIS — Z833 Family history of diabetes mellitus: Secondary | ICD-10-CM | POA: Diagnosis not present

## 2018-05-26 DIAGNOSIS — Z7982 Long term (current) use of aspirin: Secondary | ICD-10-CM | POA: Diagnosis not present

## 2018-05-26 DIAGNOSIS — F17200 Nicotine dependence, unspecified, uncomplicated: Secondary | ICD-10-CM | POA: Diagnosis not present

## 2018-05-26 DIAGNOSIS — J45909 Unspecified asthma, uncomplicated: Secondary | ICD-10-CM | POA: Diagnosis not present

## 2018-05-26 DIAGNOSIS — M549 Dorsalgia, unspecified: Secondary | ICD-10-CM | POA: Diagnosis not present

## 2018-05-27 DIAGNOSIS — Z7982 Long term (current) use of aspirin: Secondary | ICD-10-CM | POA: Diagnosis not present

## 2018-05-27 DIAGNOSIS — Z8249 Family history of ischemic heart disease and other diseases of the circulatory system: Secondary | ICD-10-CM | POA: Diagnosis not present

## 2018-05-27 DIAGNOSIS — G4730 Sleep apnea, unspecified: Secondary | ICD-10-CM | POA: Diagnosis not present

## 2018-05-27 DIAGNOSIS — Z79899 Other long term (current) drug therapy: Secondary | ICD-10-CM | POA: Diagnosis not present

## 2018-05-27 DIAGNOSIS — J45909 Unspecified asthma, uncomplicated: Secondary | ICD-10-CM | POA: Diagnosis not present

## 2018-05-27 DIAGNOSIS — Z91018 Allergy to other foods: Secondary | ICD-10-CM | POA: Diagnosis not present

## 2018-05-27 DIAGNOSIS — G4700 Insomnia, unspecified: Secondary | ICD-10-CM | POA: Diagnosis not present

## 2018-05-27 DIAGNOSIS — Z833 Family history of diabetes mellitus: Secondary | ICD-10-CM | POA: Diagnosis not present

## 2018-05-27 DIAGNOSIS — G8929 Other chronic pain: Secondary | ICD-10-CM | POA: Diagnosis not present

## 2018-05-27 DIAGNOSIS — K5900 Constipation, unspecified: Secondary | ICD-10-CM | POA: Diagnosis not present

## 2018-05-27 DIAGNOSIS — K429 Umbilical hernia without obstruction or gangrene: Secondary | ICD-10-CM | POA: Diagnosis not present

## 2018-05-27 DIAGNOSIS — I10 Essential (primary) hypertension: Secondary | ICD-10-CM | POA: Diagnosis not present

## 2018-05-27 DIAGNOSIS — M549 Dorsalgia, unspecified: Secondary | ICD-10-CM | POA: Diagnosis not present

## 2018-05-27 DIAGNOSIS — K439 Ventral hernia without obstruction or gangrene: Secondary | ICD-10-CM | POA: Diagnosis not present

## 2018-05-27 DIAGNOSIS — F17200 Nicotine dependence, unspecified, uncomplicated: Secondary | ICD-10-CM | POA: Diagnosis not present

## 2018-05-28 DIAGNOSIS — Z79899 Other long term (current) drug therapy: Secondary | ICD-10-CM | POA: Diagnosis not present

## 2018-05-28 DIAGNOSIS — J45909 Unspecified asthma, uncomplicated: Secondary | ICD-10-CM | POA: Diagnosis not present

## 2018-05-28 DIAGNOSIS — K429 Umbilical hernia without obstruction or gangrene: Secondary | ICD-10-CM | POA: Diagnosis not present

## 2018-05-28 DIAGNOSIS — F17200 Nicotine dependence, unspecified, uncomplicated: Secondary | ICD-10-CM | POA: Diagnosis not present

## 2018-05-28 DIAGNOSIS — Z91018 Allergy to other foods: Secondary | ICD-10-CM | POA: Diagnosis not present

## 2018-05-28 DIAGNOSIS — K5900 Constipation, unspecified: Secondary | ICD-10-CM | POA: Diagnosis not present

## 2018-05-28 DIAGNOSIS — G4730 Sleep apnea, unspecified: Secondary | ICD-10-CM | POA: Diagnosis not present

## 2018-05-28 DIAGNOSIS — I10 Essential (primary) hypertension: Secondary | ICD-10-CM | POA: Diagnosis not present

## 2018-05-28 DIAGNOSIS — Z8249 Family history of ischemic heart disease and other diseases of the circulatory system: Secondary | ICD-10-CM | POA: Diagnosis not present

## 2018-05-28 DIAGNOSIS — M549 Dorsalgia, unspecified: Secondary | ICD-10-CM | POA: Diagnosis not present

## 2018-05-28 DIAGNOSIS — G4700 Insomnia, unspecified: Secondary | ICD-10-CM | POA: Diagnosis not present

## 2018-05-28 DIAGNOSIS — Z833 Family history of diabetes mellitus: Secondary | ICD-10-CM | POA: Diagnosis not present

## 2018-05-28 DIAGNOSIS — Z7982 Long term (current) use of aspirin: Secondary | ICD-10-CM | POA: Diagnosis not present

## 2018-05-28 DIAGNOSIS — G8929 Other chronic pain: Secondary | ICD-10-CM | POA: Diagnosis not present

## 2018-05-29 DIAGNOSIS — Z7982 Long term (current) use of aspirin: Secondary | ICD-10-CM | POA: Diagnosis not present

## 2018-05-29 DIAGNOSIS — Z79899 Other long term (current) drug therapy: Secondary | ICD-10-CM | POA: Diagnosis not present

## 2018-05-29 DIAGNOSIS — Z8249 Family history of ischemic heart disease and other diseases of the circulatory system: Secondary | ICD-10-CM | POA: Diagnosis not present

## 2018-05-29 DIAGNOSIS — Z91018 Allergy to other foods: Secondary | ICD-10-CM | POA: Diagnosis not present

## 2018-05-29 DIAGNOSIS — Z833 Family history of diabetes mellitus: Secondary | ICD-10-CM | POA: Diagnosis not present

## 2018-05-29 DIAGNOSIS — K429 Umbilical hernia without obstruction or gangrene: Secondary | ICD-10-CM | POA: Diagnosis not present

## 2018-05-29 DIAGNOSIS — G8929 Other chronic pain: Secondary | ICD-10-CM | POA: Diagnosis not present

## 2018-05-29 DIAGNOSIS — F17200 Nicotine dependence, unspecified, uncomplicated: Secondary | ICD-10-CM | POA: Diagnosis not present

## 2018-05-29 DIAGNOSIS — I10 Essential (primary) hypertension: Secondary | ICD-10-CM | POA: Diagnosis not present

## 2018-05-29 DIAGNOSIS — M549 Dorsalgia, unspecified: Secondary | ICD-10-CM | POA: Diagnosis not present

## 2018-05-29 DIAGNOSIS — G4700 Insomnia, unspecified: Secondary | ICD-10-CM | POA: Diagnosis not present

## 2018-05-29 DIAGNOSIS — J45909 Unspecified asthma, uncomplicated: Secondary | ICD-10-CM | POA: Diagnosis not present

## 2018-05-29 DIAGNOSIS — K5900 Constipation, unspecified: Secondary | ICD-10-CM | POA: Diagnosis not present

## 2018-05-29 DIAGNOSIS — G4730 Sleep apnea, unspecified: Secondary | ICD-10-CM | POA: Diagnosis not present

## 2018-06-03 DIAGNOSIS — J40 Bronchitis, not specified as acute or chronic: Secondary | ICD-10-CM | POA: Diagnosis not present

## 2018-06-03 DIAGNOSIS — I10 Essential (primary) hypertension: Secondary | ICD-10-CM | POA: Diagnosis not present

## 2018-06-03 DIAGNOSIS — K450 Other specified abdominal hernia with obstruction, without gangrene: Secondary | ICD-10-CM | POA: Diagnosis not present

## 2018-06-03 DIAGNOSIS — Z6834 Body mass index (BMI) 34.0-34.9, adult: Secondary | ICD-10-CM | POA: Diagnosis not present

## 2018-07-23 DIAGNOSIS — Z6836 Body mass index (BMI) 36.0-36.9, adult: Secondary | ICD-10-CM | POA: Diagnosis not present

## 2018-07-23 DIAGNOSIS — Z0000 Encounter for general adult medical examination without abnormal findings: Secondary | ICD-10-CM | POA: Diagnosis not present

## 2018-08-31 DIAGNOSIS — Z6835 Body mass index (BMI) 35.0-35.9, adult: Secondary | ICD-10-CM | POA: Diagnosis not present

## 2018-08-31 DIAGNOSIS — K439 Ventral hernia without obstruction or gangrene: Secondary | ICD-10-CM | POA: Diagnosis not present

## 2018-10-14 DIAGNOSIS — Z6835 Body mass index (BMI) 35.0-35.9, adult: Secondary | ICD-10-CM | POA: Diagnosis not present

## 2018-10-14 DIAGNOSIS — J40 Bronchitis, not specified as acute or chronic: Secondary | ICD-10-CM | POA: Diagnosis not present

## 2018-10-14 DIAGNOSIS — I10 Essential (primary) hypertension: Secondary | ICD-10-CM | POA: Diagnosis not present

## 2018-12-07 DIAGNOSIS — Z6834 Body mass index (BMI) 34.0-34.9, adult: Secondary | ICD-10-CM | POA: Diagnosis not present

## 2018-12-07 DIAGNOSIS — S63591A Other specified sprain of right wrist, initial encounter: Secondary | ICD-10-CM | POA: Diagnosis not present

## 2018-12-07 DIAGNOSIS — I10 Essential (primary) hypertension: Secondary | ICD-10-CM | POA: Diagnosis not present

## 2018-12-08 DIAGNOSIS — G5601 Carpal tunnel syndrome, right upper limb: Secondary | ICD-10-CM | POA: Diagnosis not present

## 2018-12-11 DIAGNOSIS — R200 Anesthesia of skin: Secondary | ICD-10-CM | POA: Diagnosis not present

## 2018-12-11 DIAGNOSIS — R52 Pain, unspecified: Secondary | ICD-10-CM | POA: Diagnosis not present

## 2019-01-04 DIAGNOSIS — M79641 Pain in right hand: Secondary | ICD-10-CM | POA: Diagnosis not present

## 2019-01-04 DIAGNOSIS — R200 Anesthesia of skin: Secondary | ICD-10-CM | POA: Diagnosis not present

## 2019-03-27 IMAGING — NM NM MYOCAR MULTI W/SPECT W/WALL MOTION & EF
1 series · 6 of 6 positions shown · non-contrast
Comparison: none

[Series 1: rest · 6.51mm/px · 6 of 64 frames shown]
[frame 6/64]
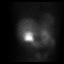
[frame 16/64]
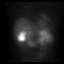
[frame 27/64]
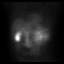
[frame 38/64]
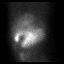
[frame 48/64]
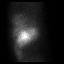
[frame 59/64]
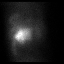

[6 of 6 positions shown; findings below may reference images not displayed]

Canned report from images found in remote index.

Refer to host system for actual result text.
